# Patient Record
Sex: Male | Born: 1935 | Race: White | Hispanic: No | Smoking: Former smoker
Health system: Southern US, Community
[De-identification: ages and names within clinical notes are randomized; demographics above are authoritative.]

## PROBLEM LIST (undated history)

## (undated) DIAGNOSIS — J449 Chronic obstructive pulmonary disease, unspecified: Secondary | ICD-10-CM

## (undated) DIAGNOSIS — I2510 Atherosclerotic heart disease of native coronary artery without angina pectoris: Secondary | ICD-10-CM

## (undated) DIAGNOSIS — I639 Cerebral infarction, unspecified: Secondary | ICD-10-CM

## (undated) DIAGNOSIS — E662 Morbid (severe) obesity with alveolar hypoventilation: Secondary | ICD-10-CM

## (undated) HISTORY — DX: Chronic obstructive pulmonary disease, unspecified: J44.9

## (undated) HISTORY — PX: OTHER SURGICAL HISTORY: SHX169

## (undated) HISTORY — DX: Morbid (severe) obesity with alveolar hypoventilation: E66.2

## (undated) HISTORY — DX: Cerebral infarction, unspecified: I63.9

## (undated) HISTORY — DX: Atherosclerotic heart disease of native coronary artery without angina pectoris: I25.10

---

## 1988-12-28 HISTORY — PX: CORONARY ARTERY BYPASS GRAFT: SHX141

## 2002-03-16 ENCOUNTER — Ambulatory Visit (HOSPITAL_COMMUNITY): Admission: RE | Admit: 2002-03-16 | Discharge: 2002-03-16 | Payer: Self-pay | Admitting: Cardiology

## 2002-03-16 ENCOUNTER — Encounter: Payer: Self-pay | Admitting: Cardiology

## 2005-04-03 ENCOUNTER — Ambulatory Visit: Payer: Self-pay | Admitting: Internal Medicine

## 2005-10-20 ENCOUNTER — Ambulatory Visit: Payer: Self-pay | Admitting: Internal Medicine

## 2006-02-25 ENCOUNTER — Ambulatory Visit: Payer: Self-pay | Admitting: Internal Medicine

## 2006-03-02 ENCOUNTER — Ambulatory Visit: Payer: Self-pay | Admitting: Internal Medicine

## 2006-11-25 ENCOUNTER — Ambulatory Visit: Payer: Self-pay | Admitting: Internal Medicine

## 2007-11-17 ENCOUNTER — Telehealth (INDEPENDENT_AMBULATORY_CARE_PROVIDER_SITE_OTHER): Payer: Self-pay | Admitting: *Deleted

## 2007-11-18 ENCOUNTER — Telehealth (INDEPENDENT_AMBULATORY_CARE_PROVIDER_SITE_OTHER): Payer: Self-pay | Admitting: *Deleted

## 2007-12-02 ENCOUNTER — Ambulatory Visit: Payer: Self-pay | Admitting: Internal Medicine

## 2007-12-07 ENCOUNTER — Telehealth: Payer: Self-pay | Admitting: Internal Medicine

## 2008-05-31 DIAGNOSIS — E119 Type 2 diabetes mellitus without complications: Secondary | ICD-10-CM

## 2008-05-31 DIAGNOSIS — I509 Heart failure, unspecified: Secondary | ICD-10-CM

## 2008-05-31 DIAGNOSIS — I6350 Cerebral infarction due to unspecified occlusion or stenosis of unspecified cerebral artery: Secondary | ICD-10-CM | POA: Insufficient documentation

## 2008-05-31 DIAGNOSIS — E678 Other specified hyperalimentation: Secondary | ICD-10-CM

## 2008-05-31 DIAGNOSIS — M109 Gout, unspecified: Secondary | ICD-10-CM

## 2008-05-31 DIAGNOSIS — J439 Emphysema, unspecified: Secondary | ICD-10-CM | POA: Insufficient documentation

## 2008-05-31 DIAGNOSIS — I2510 Atherosclerotic heart disease of native coronary artery without angina pectoris: Secondary | ICD-10-CM | POA: Insufficient documentation

## 2008-06-01 ENCOUNTER — Encounter: Payer: Self-pay | Admitting: Internal Medicine

## 2008-07-17 ENCOUNTER — Encounter: Payer: Self-pay | Admitting: Internal Medicine

## 2008-10-09 ENCOUNTER — Telehealth (INDEPENDENT_AMBULATORY_CARE_PROVIDER_SITE_OTHER): Payer: Self-pay | Admitting: *Deleted

## 2008-12-31 ENCOUNTER — Encounter (INDEPENDENT_AMBULATORY_CARE_PROVIDER_SITE_OTHER): Payer: Self-pay | Admitting: *Deleted

## 2009-01-02 ENCOUNTER — Telehealth (INDEPENDENT_AMBULATORY_CARE_PROVIDER_SITE_OTHER): Payer: Self-pay | Admitting: *Deleted

## 2009-01-04 ENCOUNTER — Encounter (INDEPENDENT_AMBULATORY_CARE_PROVIDER_SITE_OTHER): Payer: Self-pay | Admitting: *Deleted

## 2009-02-18 ENCOUNTER — Encounter (INDEPENDENT_AMBULATORY_CARE_PROVIDER_SITE_OTHER): Payer: Self-pay | Admitting: *Deleted

## 2009-02-18 ENCOUNTER — Ambulatory Visit: Payer: Self-pay | Admitting: Internal Medicine

## 2009-02-25 ENCOUNTER — Telehealth (INDEPENDENT_AMBULATORY_CARE_PROVIDER_SITE_OTHER): Payer: Self-pay | Admitting: *Deleted

## 2009-02-27 ENCOUNTER — Ambulatory Visit: Payer: Self-pay | Admitting: Internal Medicine

## 2009-03-18 ENCOUNTER — Encounter: Payer: Self-pay | Admitting: Internal Medicine

## 2009-07-03 ENCOUNTER — Encounter: Payer: Self-pay | Admitting: Internal Medicine

## 2009-09-19 ENCOUNTER — Telehealth (INDEPENDENT_AMBULATORY_CARE_PROVIDER_SITE_OTHER): Payer: Self-pay | Admitting: *Deleted

## 2009-11-26 ENCOUNTER — Encounter: Payer: Self-pay | Admitting: Internal Medicine

## 2010-03-25 IMAGING — CR DG CHEST 2V
1 series · 1 of 1 positions shown · non-contrast
Comparison: 10/20/2005

CLINICAL DATA: COPD

CHEST - 2 VIEW

[view not recorded]
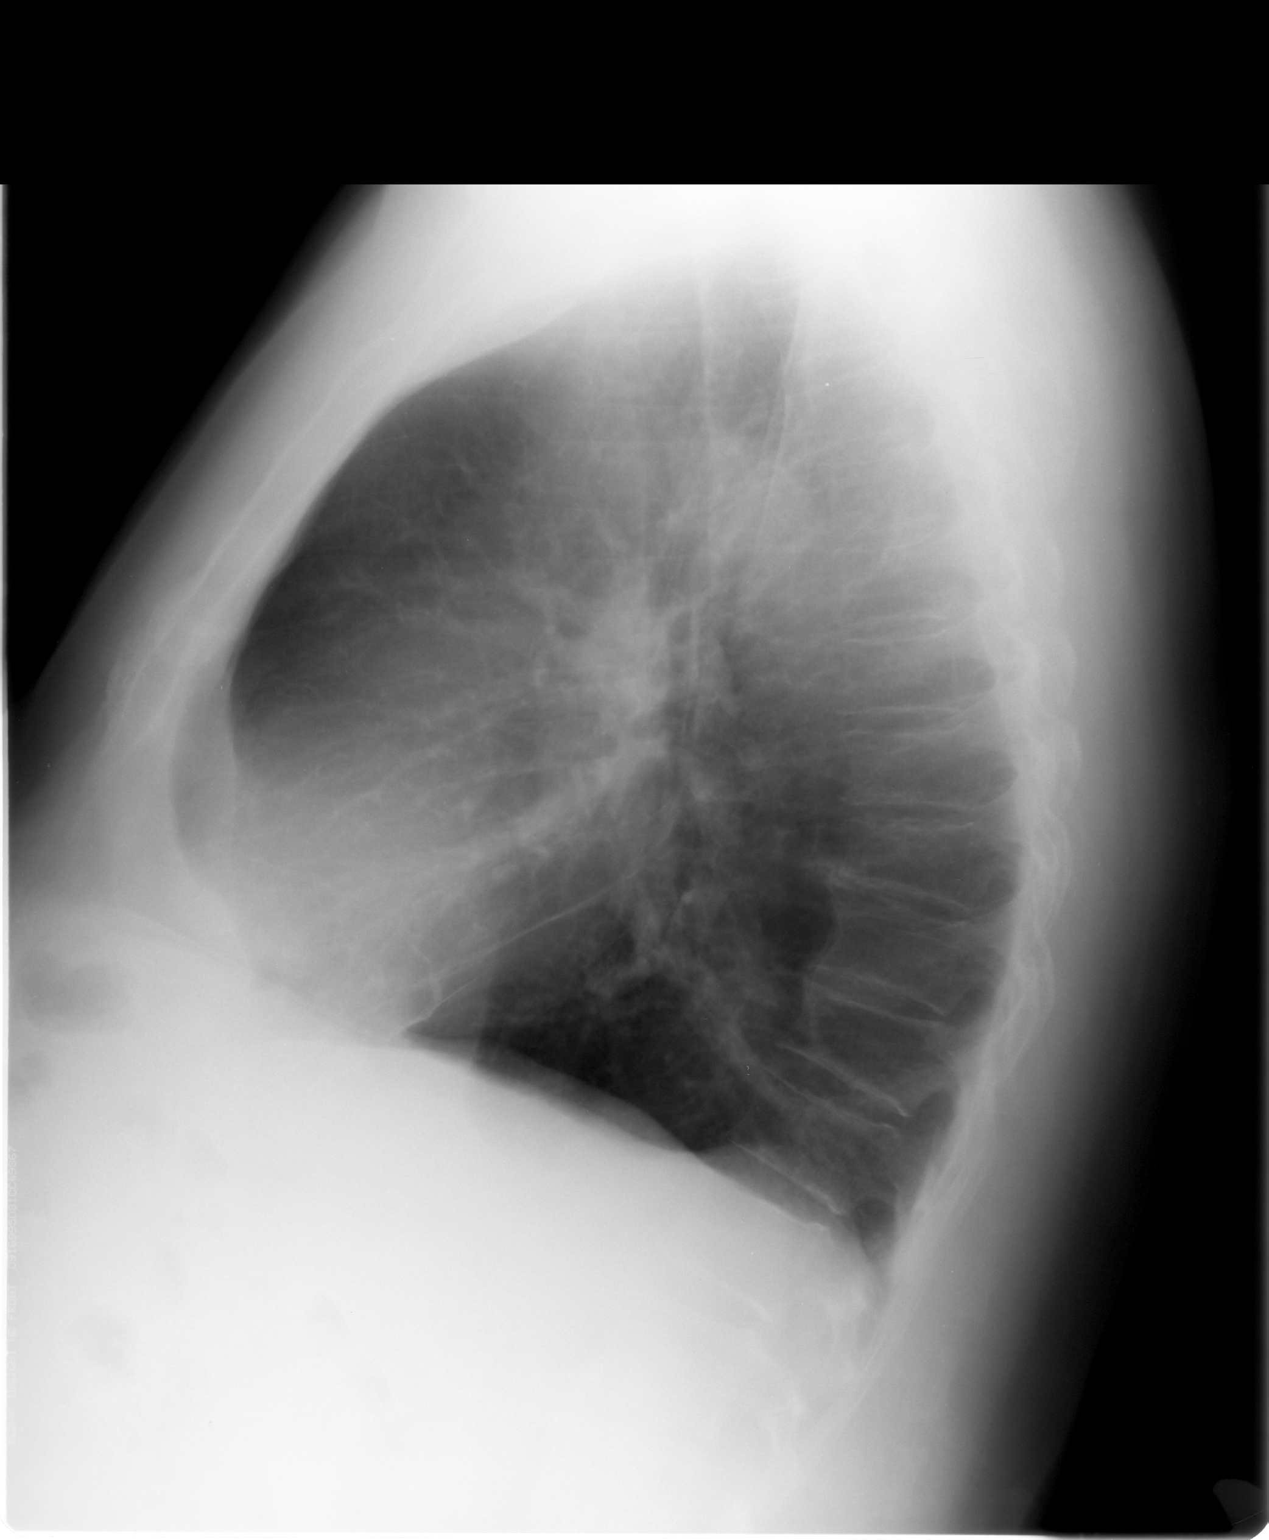

[1 of 1 positions shown; findings below may reference images not displayed]

FINDINGS: Heart size is normal.

No pleural effusions or pulmonary interstitial edema noted.

There is no airspace consolidation.

There is interstitial change consistent with COPD.
IMPRESSION: 1.  No acute findings.
2.  COPD.

## 2010-04-07 ENCOUNTER — Encounter: Payer: Self-pay | Admitting: Internal Medicine

## 2010-05-27 ENCOUNTER — Telehealth (INDEPENDENT_AMBULATORY_CARE_PROVIDER_SITE_OTHER): Payer: Self-pay | Admitting: *Deleted

## 2010-06-03 ENCOUNTER — Ambulatory Visit: Payer: Self-pay | Admitting: Internal Medicine

## 2010-07-22 ENCOUNTER — Telehealth (INDEPENDENT_AMBULATORY_CARE_PROVIDER_SITE_OTHER): Payer: Self-pay | Admitting: *Deleted
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
## 2011-01-27 NOTE — Letter (Signed)
Summary: [facility]  [facility]   Imported By: Lennie Odor 04/16/2010 10:44:34  _____________________________________________________________________  External Attachment:    Type:   Image     Comment:   External Document

## 2011-01-27 NOTE — Progress Notes (Signed)
Summary: prescription --LMTCB x 1  Phone Note Call from Patient Call back at [phone]   Caller: Daughter kari Call For: young Summary of Call: pt need albuterol inhaler [facility] at [phone].  Follow-up for Phone Call        Proair Rx sent to [facility].  LMOMTCB to inform daughter of this. Gweneth Dimitri RN  July 22, 2010 11:01 AM  [facility] informing pt's daughter rx sent to pharmacy.  Aundra Millet Reynolds LPN  July 22, 2010 2:44 PM      Prescriptions: PROAIR HFA 108 (90 BASE) MCG/ACT AERS (ALBUTEROL SULFATE) 2 sprays up to four times a day as needed  #3 x 3   Entered by:   Gweneth Dimitri RN   Authorized by:   Waymon Budge MD   Signed by:   Gweneth Dimitri RN on 07/22/2010   Method used:   Electronically to        [facility] (mail-order)       [address]       [place], [place]  [address]       Ph: [phone]       Fax: [phone]   RxID:   [id number]

## 2011-01-27 NOTE — Progress Notes (Signed)
Summary: refill on albuterol- pt needs ov  Phone Note Call from Patient Call back at [phone]   Caller: Daughter carrie Call For: young Summary of Call: calling for refill on albuterol neb .083% pres[facility] # [id number] pt almost out Initial call taken by: Rickard Patience,  May 27, 2010 4:41 PM  Follow-up for Phone Call        called and spoke with daughter, Lyla Son.  Informed Lyla Son pt is overdue for a f/u appt.  Pt was last seen Feb 2011.  Lyla Son did schedule pt for f/u appt 06-03-2010 at 10:15am.  Informed Lyla Son a 1 time refill will be sent to pharmacy of choice ([facility]) but pt must keep this appt for future refills.  carrie verbalized understanding. Marland KitchenArman Filter LPN  May 27, 2010 5:12 PM     Prescriptions: ALBUTEROL SULFATE (2.5 MG/3ML) 0.083%  NEBU (ALBUTEROL SULFATE) use four times a day  #120 x 0   Entered by:   Arman Filter LPN   Authorized by:   Waymon Budge MD   Signed by:   Arman Filter LPN on 91/47/8295   Method used:   Electronically to        [facility] (mail-order)       [address]       [place], [place]  [address]       Ph: [phone]       Fax: [phone]   RxID:   [id number]

## 2011-02-10 ENCOUNTER — Telehealth: Payer: Self-pay | Admitting: Internal Medicine

## 2011-02-18 NOTE — Progress Notes (Signed)
  Phone Note Refill Request Message from:  Fax from Pharmacy  Refills Requested: Medication #1:  ALBUTEROL SULFATE 4 MG  TABS Take 1 by mouth two times a day as needed   Supply Requested: 3 months Initial call taken by: Zackery Barefoot CMA,  February 10, 2011 10:16 AM    Prescriptions: ALBUTEROL SULFATE 4 MG  TABS (ALBUTEROL SULFATE) Take 1 by mouth two times a day as needed  #180 x 3   Entered by:   Zackery Barefoot CMA   Authorized by:   Waymon Budge MD   Signed by:   Zackery Barefoot CMA on 02/10/2011   Method used:   Faxed to ...       [facility] [address]* (retail)       [address]       [place], [place]  [address]       Ph: [phone] or [phone]       Fax: [phone]   RxID:   [id number]

## 2011-05-12 NOTE — Assessment & Plan Note (Signed)
 HEALTHCARE                             PULMONARY OFFICE NOTE   TIELER, COURNOYER                        MRN:          [id number]  DATE:12/02/2007                            DOB:          [birth year]    PROBLEMS:  1. Chronic obstructive pulmonary disease.  2. Obesity hypoventilation syndrome.  3. Coronary artery disease with history of heart failure.  4. Status post cerebral vascular accident.  5. Diabetes.  6. History of gout.   HISTORY:  First visit since March 2007. He had been hospitalized in [facility] a year ago for his respiratory problems. We had gotten a request  from [employer] to follow up on oxygen status with an overnight oximetry  on 11/14/2007 recording a nadir of 83% on room air and 14 minutes with  saturation less than 88% during sleep. He does not remember how he got  in touch with that company. Pulmonary function tests here on 03/02/2006  had shown severe chronic obstructive pulmonary disease with an FEV1 of  0.95 (41% of predicted) and an FEV1/FVC of 0.39. Diffusion was 53% of  predicted. Lung volumes were not valid.   MEDICATIONS:  1. Home nebulizer with albuterol q.i.d.  2. Albuterol/Vospire tablets 4 mg b.i.d. p.r.n.  3. Plavix 75 mg.  4. Lorazepam 1 mg b.i.d.  5. Enalapril 20 mg.  6. Avandaryl 4 mg/Glimepiride 2 mg.  7. Bumetanide 1 mg.  8. Ecotrin.  9. Albuterol inhaler p.r.n.   Drug intolerant CODEINE, MORPHINE, and TYLENOL.   OBJECTIVE:  Weight 184 pounds, blood pressure 120/74, pulse 74, room air  saturation at rest 96%. He seems alert and comfortable sitting quietly  on room air. Chest is quiet. Airflow is fair. There is no wheeze. He has  no neck vein distension, cyanosis, clubbing, or edema. Heart sounds are  regular without murmur.   IMPRESSION:  Severe chronic obstructive pulmonary disease with hypoxia  during sleep sufficient to justify oxygen at home at night during sleep.   PLAN:  1. Add home oxygen 2  liters per minute while sleeping with oxygen      discussion done.  2. Schedule return 6 months, earlier p.r.n.     Clinton D. Maple Hudson, MD, Tonny Bollman, FACP  Electronically Signed    [name]/MedQ  DD: 12/03/2007  DT: 12/04/2007  Job #: [id number]   cc:   Thereasa Solo. Little, M.D.  Jari Favre

## 2011-05-15 NOTE — Assessment & Plan Note (Signed)
[facility]                             PULMONARY OFFICE NOTE   VAISHNAV, DEMARTIN                        MRN:          [id number]  DATE:11/25/2006                            DOB:          Apr 23, 1930    PROBLEM:  1. Chronic obstructive pulmonary disease.  2. Diabetes.  3. Status post cerebrovascular accident.  4. Coronary disease/heart failure.  5. Obesity/hypoventilation.  6. Gout.   HISTORY:  Seen today without his full chart.  He did get flu vaccine.  There is remote history of pneumococcal vaccine he thinks about 10 years  ago.  He is not sure whether he got PFTs after his last visit here  (which I find interesting), but the technical records indicate that was  done and we will pull that report.  He denies any acute event or new  problems.   MEDICATIONS:  Unchanged from last visit.   ALLERGIES:  Drug intolerance to CODEINE, MORPHINE, TYLENOL.   OBJECTIVE:  Weight 180 pounds.  Blood pressure 110/60, pulse regular 68,  room air saturation 96%, quiet clear chest. Heart sounds are regular  without murmur or gallop.  There is no cyanosis or edema, no adenopathy.   IMPRESSION:  Clinically stable COPD.   PLAN:  1. We will seek the pulmonary function test report if done or      reschedule if necessary.  2. Pneumococcal vaccine booster.  3. Schedule return 6 months, earlier p.r.n.     Clinton D. Maple Hudson, MD, Tonny Bollman, FACP  Electronically Signed    [name]/MedQ  DD: 11/27/2006  DT: 11/28/2006  Job #: [id number]   cc:   Jari Favre

## 2011-05-21 ENCOUNTER — Encounter: Payer: Self-pay | Admitting: Internal Medicine

## 2011-06-03 ENCOUNTER — Ambulatory Visit: Payer: Self-pay | Admitting: Internal Medicine

## 2011-07-06 ENCOUNTER — Telehealth: Payer: Self-pay | Admitting: Internal Medicine

## 2011-07-06 MED ORDER — ALBUTEROL SULFATE (2.5 MG/3ML) 0.083% IN NEBU: 2.5000 mg | INHALATION_SOLUTION | RESPIRATORY_TRACT | Status: DC | PRN

## 2011-07-06 NOTE — Telephone Encounter (Signed)
Spoke with pt grand-daughter and she states the pt needs refill on neb medication. I advised pt last seen over 1 yr so he needs an appt. Pt set to see CY on 08-04-11. Refill sent to last till then. Carron Curie, CMA

## 2011-08-04 ENCOUNTER — Ambulatory Visit (INDEPENDENT_AMBULATORY_CARE_PROVIDER_SITE_OTHER): Payer: Medicare Other | Admitting: Internal Medicine

## 2011-08-04 ENCOUNTER — Encounter: Payer: Self-pay | Admitting: Internal Medicine

## 2011-08-04 VITALS — BP 138/62 | HR 69 | Wt 165.0 lb

## 2011-08-04 DIAGNOSIS — J449 Chronic obstructive pulmonary disease, unspecified: Secondary | ICD-10-CM

## 2011-08-04 MED ORDER — ALBUTEROL SULFATE (2.5 MG/3ML) 0.083% IN NEBU: 2.5000 mg | INHALATION_SOLUTION | RESPIRATORY_TRACT | Status: DC | PRN

## 2011-08-04 NOTE — Patient Instructions (Signed)
Continue present meds.  Ok to sent refill for neb solution to [facility]  3 month script

## 2011-08-04 NOTE — Progress Notes (Signed)
Subjective:    Patient ID: Brian Kirby, male    DOB: September 08, 1930, 77 y.o.   MRN: [id number]  HPI 08/04/11- 88 yoM former smoker followed for severe COPD/ emphysema Last here - June 03, 2010- PFT and CXR reviewed from that visit. Had CXR in [facility] in October.  He had fallen > replaced left hip. Had cancer resected right mandible. -  Breathing not much changed- ok at rest, little cough, no phlegm. Sleeps with O2 3l/m ? Company. Very easy DOE with any light walking- paces himself, but never uses O2 in daytime.  Uses nebulizer, sometimes his inhaler.   Review of Systems Constitutional:   No-   weight loss, night sweats, fevers, chills, fatigue, lassitude. HEENT:   No-   headaches, difficulty swallowing, tooth/dental problems, sore throat,                  No-   sneezing, itching, ear ache, nasal congestion, post nasal drip,  CV:  No-   chest pain, orthopnea, PND, swelling in lower extremities, anasarca, dizziness, palpitations GI:  No-   heartburn, indigestion, abdominal pain, nausea, vomiting, diarrhea,                 change in bowel habits, loss of appetite Resp: + shortness of breath with exertion or at rest.  No-  excess mucus,             No-   productive cough,  No non-productive cough,  No-  coughing up of blood.              No-   change in color of mucus.  No- wheezing.   Skin: No-   rash or lesions. GU: No-   dysuria, change in color of urine, no urgency or frequency.  No- flank pain. MS:  No- Acute  joint pain or swelling.  No- decreased range of motion.  No- back pain. Psych:  No- change in mood or affect. No depression or anxiety.  No memory loss.     Objective:   Physical Exam General- Alert, Oriented, Affect-appropriate, Distress- none acute Skin- rash-none, lesions- none, excoriation- none Lymphadenopathy- none Head- atraumatic            Eyes- Gross vision intact, PERRLA, conjunctivae clear secretions            Ears- Hearing, canals- hearing aid left, quite  hard of hearing                       Nose- Clear, no-Septal dev, mucus, polyps, erosion, perforation             Throat- Mallampati II , mucosa clear , drainage- none, tonsils- atrophic   dentures Neck- flexible , trachea midline, no stridor , thyroid nl, carotid no bruit Chest - symmetrical excursion , unlabored           Heart/CV- RRR distant , no murmur , no gallop  , no rub, nl s1 s2                           - JVD- none , edema- none, stasis changes- none, varices- none           Lung- clear to P&A, wheeze- none, cough- none , dullness-none, rub- none           Chest wall-  Abd- tender-no, distended-no, bowel sounds-present, HSM- no Br/ Gen/ Rectal- Not  done, not indicated Extrem- cyanosis- none, clubbing, none, atrophy- none, strength- nl Neuro- grossly intact to observation         Assessment & Plan:

## 2011-08-04 NOTE — Assessment & Plan Note (Addendum)
Severe COPD with multiple comorbidities and weakness. He seems well controlled now. We discussed meds and will refill neb solution. Continue oxygen for sleep.

## 2011-08-08 ENCOUNTER — Encounter: Payer: Self-pay | Admitting: Internal Medicine

## 2011-08-17 ENCOUNTER — Telehealth: Payer: Self-pay | Admitting: Internal Medicine

## 2011-08-17 NOTE — Telephone Encounter (Signed)
I called and spoke with company-aware we faxed back last week regarding this matter but gave verbal over phone for directions of 1 vial qid prn.

## 2011-11-23 ENCOUNTER — Telehealth: Payer: Self-pay | Admitting: Internal Medicine

## 2011-11-23 MED ORDER — ALBUTEROL SULFATE (2.5 MG/3ML) 0.083% IN NEBU: 2.5000 mg | INHALATION_SOLUTION | Freq: Four times a day (QID) | RESPIRATORY_TRACT | Status: DC | PRN

## 2011-11-23 NOTE — Telephone Encounter (Signed)
Pt's daughter says that the pt has new insurance and now needs to [facility] [address] [facility] [place]. Daughter aware this has been done and to call if there are any problems. Daughter aware this may need to be filed under MCR Part B.

## 2011-11-24 ENCOUNTER — Other Ambulatory Visit: Payer: Self-pay | Admitting: Internal Medicine

## 2011-11-24 ENCOUNTER — Telehealth: Payer: Self-pay | Admitting: Internal Medicine

## 2011-11-24 MED ORDER — ALBUTEROL SULFATE (2.5 MG/3ML) 0.083% IN NEBU: 2.5000 mg | INHALATION_SOLUTION | Freq: Four times a day (QID) | RESPIRATORY_TRACT | Status: DC | PRN

## 2011-11-24 NOTE — Telephone Encounter (Signed)
BETSY W/ [employer] CALLED BACK. SHE STATES THAT PART D COVERAGE HAS DENIED THE ALBUTEROL BUT PT IS COVERED UNDER MEDICARE PART B. ALSO ADVISES THAT "[facility]" WILL FILE AN ELECTRONIC CLAIM WITH BLUE MEDICARE IF PT WANTS TO GET THIS FROM [facility]. Hazel Sams

## 2011-11-24 NOTE — Telephone Encounter (Signed)
RX PRINTED AND DID NOT GO ELECTRONICALLY.

## 2011-11-24 NOTE — Telephone Encounter (Signed)
I spoke with Brian Kirby and she wants this sent to [facility] on [address]. She will take all of the pt's insurance information when she goes to pick this up. She is aware this will need to filed under MCR Part B. New rx sent to [facility].

## 2011-11-24 NOTE — Telephone Encounter (Signed)
Spoke with [place] at [facility] @ [phone]. Member # [id number]. Gave clinical information via phone call. Tamela Oddi was going to review this with her supervisor and see if this can be approved for the patient tonight. If approved, pt will possibly need to get this medication at [facility] or through the DME company because they will file this electronically with MCR Part B. Will await callback from [facility].

## 2011-11-27 ENCOUNTER — Telehealth: Payer: Self-pay | Admitting: Internal Medicine

## 2011-11-27 MED ORDER — ALBUTEROL SULFATE (2.5 MG/3ML) 0.083% IN NEBU: 2.5000 mg | INHALATION_SOLUTION | Freq: Four times a day (QID) | RESPIRATORY_TRACT | Status: DC | PRN

## 2011-11-27 NOTE — Telephone Encounter (Signed)
Spoke with pt's daughter and she states that the [facility] on [address] never received rx for albuterol. I looked back and looks like that is b/c it was sent to [facility] on [address] in [place], not [place]. I have resent this to the correct pharm and nothing further needed.

## 2012-01-04 ENCOUNTER — Telehealth: Payer: Self-pay | Admitting: Internal Medicine

## 2012-01-04 MED ORDER — ALBUTEROL SULFATE HFA 108 (90 BASE) MCG/ACT IN AERS: 2.0000 | INHALATION_SPRAY | Freq: Four times a day (QID) | RESPIRATORY_TRACT | Status: DC | PRN

## 2012-01-04 NOTE — Telephone Encounter (Signed)
Pt's grandaughter informed that [facility].

## 2012-01-25 ENCOUNTER — Other Ambulatory Visit: Payer: Self-pay | Admitting: Internal Medicine

## 2012-02-18 ENCOUNTER — Other Ambulatory Visit: Payer: Self-pay | Admitting: Internal Medicine

## 2012-06-06 ENCOUNTER — Telehealth: Payer: Self-pay | Admitting: Internal Medicine

## 2012-06-06 MED ORDER — ALBUTEROL SULFATE (2.5 MG/3ML) 0.083% IN NEBU: INHALATION_SOLUTION | RESPIRATORY_TRACT | Status: DC

## 2012-06-06 MED ORDER — ALBUTEROL SULFATE HFA 108 (90 BASE) MCG/ACT IN AERS: 2.0000 | INHALATION_SPRAY | Freq: Four times a day (QID) | RESPIRATORY_TRACT | Status: DC | PRN

## 2012-06-06 NOTE — Telephone Encounter (Signed)
RX's have been sent and nothing further was needed 

## 2012-06-17 ENCOUNTER — Other Ambulatory Visit: Payer: Self-pay | Admitting: Internal Medicine

## 2012-06-17 MED ORDER — ALBUTEROL SULFATE (2.5 MG/3ML) 0.083% IN NEBU: INHALATION_SOLUTION | RESPIRATORY_TRACT | Status: DC

## 2012-06-17 NOTE — Telephone Encounter (Signed)
Pharmacy needed dx code for albuterol neb

## 2012-08-04 ENCOUNTER — Ambulatory Visit: Payer: Medicare Other | Admitting: Internal Medicine

## 2012-08-11 ENCOUNTER — Other Ambulatory Visit: Payer: Self-pay | Admitting: Internal Medicine

## 2012-09-14 ENCOUNTER — Ambulatory Visit: Payer: Medicare Other | Admitting: Internal Medicine

## 2012-11-29 ENCOUNTER — Telehealth: Payer: Self-pay | Admitting: Internal Medicine

## 2012-11-29 MED ORDER — ALBUTEROL SULFATE (2.5 MG/3ML) 0.083% IN NEBU: INHALATION_SOLUTION | RESPIRATORY_TRACT | Status: DC

## 2012-11-29 NOTE — Telephone Encounter (Signed)
Rx was sent to [facility] and notify the pt, and NA, no option to leave a msg [facility]

## 2012-11-30 NOTE — Telephone Encounter (Signed)
Left message that Rx has been sent to pharmacy; if any questions or concerns to please call our office.

## 2013-02-02 ENCOUNTER — Other Ambulatory Visit: Payer: Self-pay | Admitting: Internal Medicine

## 2013-02-14 ENCOUNTER — Other Ambulatory Visit: Payer: Self-pay | Admitting: Internal Medicine

## 2013-02-14 NOTE — Telephone Encounter (Signed)
Last OV 08/04/11 with no pending appts.  Please advise on Ventolin HFA refill.

## 2013-02-15 NOTE — Telephone Encounter (Signed)
Ok to refill ventolin  

## 2013-02-17 ENCOUNTER — Telehealth: Payer: Self-pay | Admitting: *Deleted

## 2013-02-17 MED ORDER — ALBUTEROL SULFATE HFA 108 (90 BASE) MCG/ACT IN AERS: 2.0000 | INHALATION_SPRAY | Freq: Four times a day (QID) | RESPIRATORY_TRACT | Status: DC | PRN

## 2013-02-17 NOTE — Telephone Encounter (Signed)
Spoke with the pt and he is requesting a refill on proair. Refill sent. Pt is aware. Carron Curie, CMA

## 2013-06-06 ENCOUNTER — Other Ambulatory Visit: Payer: Self-pay | Admitting: Internal Medicine

## 2013-06-09 ENCOUNTER — Telehealth: Payer: Self-pay | Admitting: Internal Medicine

## 2013-06-09 MED ORDER — ALBUTEROL SULFATE 4 MG PO TABS: 4.0000 mg | ORAL_TABLET | Freq: Two times a day (BID) | ORAL | Status: DC

## 2013-06-09 NOTE — Telephone Encounter (Signed)
Spoke to pt's granddaughter. I have refilled Proventil 4mg  tablets. Pt has an upcoming appointment on 6/2/314. I advised her that they need to make sure they come to that appointment. She agreed.

## 2013-06-19 ENCOUNTER — Ambulatory Visit (INDEPENDENT_AMBULATORY_CARE_PROVIDER_SITE_OTHER): Payer: Medicare Other | Admitting: Internal Medicine

## 2013-06-19 ENCOUNTER — Encounter: Payer: Self-pay | Admitting: Internal Medicine

## 2013-06-19 VITALS — BP 118/64 | HR 83 | Ht 63.5 in | Wt 158.2 lb

## 2013-06-19 DIAGNOSIS — E678 Other specified hyperalimentation: Secondary | ICD-10-CM

## 2013-06-19 DIAGNOSIS — J439 Emphysema, unspecified: Secondary | ICD-10-CM

## 2013-06-19 DIAGNOSIS — J438 Other emphysema: Secondary | ICD-10-CM

## 2013-06-19 NOTE — Progress Notes (Signed)
Subjective:    Patient ID: Brian Kirby, male    DOB: 1930/11/12, 79 y.o.   MRN: [id number]  HPI 08/04/11- 48 yoM former smoker followed for severe COPD/ emphysema Last here - June 03, 2010- PFT and CXR reviewed from that visit. Had CXR in [facility] in October.  He had fallen > replaced left hip. Had cancer resected right mandible. -  Breathing not much changed- ok at rest, little cough, no phlegm. Sleeps with O2 3l/m ? Company. Very easy DOE with any light walking- paces himself, but never uses O2 in daytime.  Uses nebulizer, sometimes his inhaler.   06/19/13- 29 yoM former smoker followed for severe COPD/ emphysema FOLLOWS FOR: last seent 2012; continues to have SOB with activity; pt uses O2 at night-entered exam at 85%RA; 1 minute post was at 90%RA. Dropped off plavix due to epistaxis, w/ no cardiology f/u since Dr Clarene Duke retired. Using nebulizer 4 times daily and occasional rescue inhaler. Denies acute problem. He still can't name his DME company for oxygen. Portable tank is too heavy. Had desaturated on arrival on room air.  Review of Systems Constitutional:   No-   weight loss, night sweats, fevers, chills, fatigue, lassitude. HEENT:   No-   headaches, difficulty swallowing, tooth/dental problems, sore throat,                  No-   sneezing, itching, ear ache, nasal congestion, post nasal drip,  CV:  No-   chest pain, orthopnea, PND, swelling in lower extremities, anasarca, dizziness, palpitations GI:  No-   heartburn, indigestion, abdominal pain, nausea, vomiting,  Resp: + shortness of breath with exertion or at rest.  No-  excess mucus,             No-   productive cough,  No non-productive cough,  No-  coughing up of blood.              No-   change in color of mucus.  No- wheezing.   Skin: No-   rash or lesions. GU:  MS:  No- Acute  joint pain or swelling.  Marland Kitchen Psych:  No- change in mood or affect. No depression or anxiety.  No memory loss.  Objective:   Physical  Exam General- Alert, Oriented, Affect-appropriate, Distress- none acute Skin- rash-none, lesions- none, excoriation- none Lymphadenopathy- none Head- atraumatic            Eyes- Gross vision intact, PERRLA, conjunctivae clear secretions            Ears- Hearing, canals- hearing aid left, +quite hard of hearing                       Nose- Clear, no-Septal dev, mucus, polyps, erosion, perforation             Throat- Mallampati II , mucosa clear , drainage- none, tonsils- atrophic   dentures Neck- flexible , trachea midline, no stridor , thyroid nl, carotid no bruit Chest - symmetrical excursion , unlabored           Heart/CV- RRR distant , no murmur , no gallop  , no rub, nl s1 s2                           - JVD- none , edema- none, stasis changes- none, varices- none           Lung- +distant/clear to  P&A, wheeze- none, cough- none , dullness-none, rub- none           Chest wall-  Abd-  Br/ Gen/ Rectal- Not done, not indicated Extrem- cyanosis- none, clubbing, none, atrophy- none, strength- nl Neuro- grossly intact to observation    Assessment & Plan:

## 2013-06-19 NOTE — Patient Instructions (Addendum)
We can continue present meds. Try to skip the albuterol/ Proventil pills unless really needed  Please call as needed  Be sure to follow up with your primary physician at least once per year.

## 2013-07-02 ENCOUNTER — Encounter: Payer: Self-pay | Admitting: Internal Medicine

## 2013-07-02 NOTE — Assessment & Plan Note (Signed)
We need to get the name of his home care company for oxygen supplies and have them assess for light portable oxygen. Standard portable tank is too heavy for him. Plan-chest x-ray

## 2013-07-02 NOTE — Assessment & Plan Note (Signed)
He has not been able to achieve weight loss

## 2013-07-12 ENCOUNTER — Other Ambulatory Visit: Payer: Self-pay | Admitting: Internal Medicine

## 2013-08-29 ENCOUNTER — Other Ambulatory Visit: Payer: Self-pay | Admitting: Internal Medicine

## 2013-09-04 ENCOUNTER — Telehealth: Payer: Self-pay | Admitting: Internal Medicine

## 2013-09-04 NOTE — Telephone Encounter (Signed)
lmomtcb x1 for cari

## 2013-09-05 MED ORDER — ALBUTEROL SULFATE 4 MG PO TABS: ORAL_TABLET | ORAL | Status: DC

## 2013-09-05 NOTE — Telephone Encounter (Signed)
Pt's daughter, Corliss Blacker, returned triage's call.  Advised Rx for albuterol tab was sent to [facility].  Carri verbalized understanding & states nothing further needed at this time.  Antionette Fairy

## 2013-09-05 NOTE — Telephone Encounter (Signed)
Albuterol tab rx sent to [facility].   lmomtcb to inform carri

## 2013-09-19 ENCOUNTER — Telehealth: Payer: Self-pay | Admitting: Internal Medicine

## 2013-09-19 NOTE — Telephone Encounter (Signed)
lmomtcb for [employer]

## 2013-09-20 NOTE — Telephone Encounter (Signed)
LMTCB

## 2013-09-20 NOTE — Telephone Encounter (Signed)
Spoke with Brian Kirby She states pt's ins no longer covers albuterol tablets  Rx for 1 mo costs over 400$ Please advise on alternative thanks

## 2013-09-20 NOTE — Telephone Encounter (Signed)
Brian Kirby returning call can be reached at [phone].Brian Kirby

## 2013-09-20 NOTE — Telephone Encounter (Signed)
Ask patient to try without the tabs. He can use his nebulizer up to every 4 hours WHEN NEEDED

## 2013-09-20 NOTE — Telephone Encounter (Signed)
LMTCB for Brian Kirby

## 2013-09-21 NOTE — Telephone Encounter (Signed)
Pt granddaughter is aware. Carron Curie, CMA

## 2013-09-21 NOTE — Telephone Encounter (Signed)
LMTCB

## 2013-10-03 ENCOUNTER — Telehealth: Payer: Self-pay | Admitting: Internal Medicine

## 2013-10-03 NOTE — Telephone Encounter (Signed)
Called, spoke with [name].  Albuterol tabs were going to be over $400 so the following recs were given:  Brian Budge, MD at 09/20/2013 8:18 PM   Status: Signed            Ask patient to try without the tabs. He can use his nebulizer up to every 4 hours WHEN NEEDED   ------  Brian Kirby reports pt is using the albuterol neb 4-5 times daily with no help.  States he is having a hard time breathing at rest and with exertion x 2 wks and some wheezing.  No c/o chest tightness.  [name] unsure of any cough.  She is requesting [name]'s further thoughts.  Dr. Maple Hudson, pls advise.  Thank you.  Allergies verified with [name]: Allergies  Allergen Reactions  . Acetaminophen   . Codeine   . Morphine     [facility]  Last OV with [name]: 06/19/13; asked to f/u in 1 yr

## 2013-10-03 NOTE — Telephone Encounter (Signed)
I spoke with Brian Kirby. I scheduled pt an appt per her schedule to see TP on Monday. Nothing further needed

## 2013-10-03 NOTE — Telephone Encounter (Signed)
Per CY-see for OV with TP.

## 2013-10-09 ENCOUNTER — Encounter: Payer: Self-pay | Admitting: Adult Health

## 2013-10-09 ENCOUNTER — Ambulatory Visit (INDEPENDENT_AMBULATORY_CARE_PROVIDER_SITE_OTHER)
Admission: RE | Admit: 2013-10-09 | Discharge: 2013-10-09 | Disposition: A | Discharge status: Home / Self Care | Payer: Medicare Other | Source: Ambulatory Visit | Attending: Adult Health | Admitting: Adult Health

## 2013-10-09 ENCOUNTER — Ambulatory Visit (INDEPENDENT_AMBULATORY_CARE_PROVIDER_SITE_OTHER): Payer: Medicare Other | Admitting: Adult Health

## 2013-10-09 VITALS — BP 116/62 | HR 83 | Temp 97.3°F | Ht 63.5 in | Wt 156.4 lb

## 2013-10-09 DIAGNOSIS — J438 Other emphysema: Secondary | ICD-10-CM

## 2013-10-09 DIAGNOSIS — Z23 Encounter for immunization: Secondary | ICD-10-CM

## 2013-10-09 DIAGNOSIS — J439 Emphysema, unspecified: Secondary | ICD-10-CM

## 2013-10-09 MED ORDER — IPRATROPIUM BROMIDE 0.02 % IN SOLN: 500.0000 ug | Freq: Four times a day (QID) | RESPIRATORY_TRACT | Status: DC

## 2013-10-09 NOTE — Patient Instructions (Signed)
Add Ipratropium Neb Four times a day   Continue on Albuterol Neb Four times a day   I will call with chest xray results.  follow up Dr. Maple Hudson  In 3 months and As needed

## 2013-10-09 NOTE — Assessment & Plan Note (Addendum)
Steroid Dependent severe COPD  W/ nocturnal O2 Dependence  More symptomatic off albuterol tabs . Unable to afford d/t insurance coverage.  Will add Atrovent neb Four times a day  W/ albuterol nebs for improved control  Check cxr today.  Flu shot utd  No change in steroid dose as no active bronchospasm currently .   Plan  Add Ipratropium Neb Four times a day   Continue on Albuterol Neb Four times a day   I will call with chest xray results.  follow up Dr. Maple Hudson  In 3 months and As needed

## 2013-10-09 NOTE — Progress Notes (Signed)
Subjective:    Patient ID: Brian Kirby, male    DOB: July 16, 1930, 79 y.o.   MRN: [id number]  HPI 08/04/11- 65 yoM former smoker followed for severe COPD/ emphysema Last here - June 03, 2010- PFT and CXR reviewed from that visit. Had CXR in [facility] in October.  He had fallen > replaced left hip. Had cancer resected right mandible. -  Breathing not much changed- ok at rest, little cough, no phlegm. Sleeps with O2 3l/m ? Company. Very easy DOE with any light walking- paces himself, but never uses O2 in daytime.  Uses nebulizer, sometimes his inhaler.   06/19/13- 87 yoM former smoker followed for severe COPD/ emphysema FOLLOWS FOR: last seent 2012; continues to have SOB with activity; pt uses O2 at night-entered exam at 85%RA; 1 minute post was at 90%RA. Dropped off plavix due to epistaxis, w/ no cardiology f/u since Dr Clarene Duke retired. Using nebulizer 4 times daily and occasional rescue inhaler. Denies acute problem. He still can't name his DME company for oxygen. Portable tank is too heavy. Had desaturated on arrival on room air.  10/09/2013 Acute OV -Young Pt with severe steroid/nocturnal O2 COPD pt.  Complains of increased SOB x2 weeks since running out of albuterol tabs (too expensive at >$400/mo).  denies any wheezing, chest congestion, tightness, cough.  increased albuterol neb with no relief. Takes albuterol Four times a day   On prednisone 5mg  daily . N Mild dry cough, no orthopnea, leg swelling, chest pain, edema.  Not much wheezing.  Insurance will no longer pay for albuterol tab-too costly for pt.  Of note was recommended to stop albuterol tabs last ov. With Dr. Maple Hudson   Last cxr with chronic changes 2012 .   Review of Systems Constitutional:   No-   weight loss, night sweats, fevers, chills, fatigue, lassitude. HEENT:   No-   headaches, difficulty swallowing, tooth/dental problems, sore throat,                  No-   sneezing, itching, ear ache, nasal congestion, post nasal  drip,  CV:  No-   chest pain, orthopnea, PND, swelling in lower extremities, anasarca, dizziness, palpitations GI:  No-   heartburn, indigestion, abdominal pain, nausea, vomiting,  Resp: + shortness of breath with exertion or at rest.  No-  excess mucus,             No-   productive cough,  No non-productive cough,  No-  coughing up of blood.              No-   change in color of mucus.  No- wheezing.   Skin: No-   rash or lesions. GU:  MS:  No- Acute  joint pain or swelling.  Marland Kitchen Psych:  No- change in mood or affect. No depression or anxiety.  No memory loss.  Objective:   Physical Exam General- Alert, Oriented, Affect-appropriate, Distress- none acute Skin- rash-none, lesions- none, excoriation- none Lymphadenopathy- none Head- atraumatic            Eyes- Gross vision intact, PERRLA, conjunctivae clear secretions            Ears- Hearing, canals- hearing aid left, +quite hard of hearing                       Nose- Clear, no-Septal dev, mucus, polyps, erosion, perforation             Throat-  Mallampati II , mucosa clear , drainage- none, tonsils- atrophic   dentures Neck- flexible , trachea midline, no stridor , thyroid nl, carotid no bruit Chest - symmetrical excursion , unlabored           Heart/CV- RRR distant , no murmur , no gallop  , no rub, nl s1 s2                           - JVD- none , edema- none, stasis changes- none, varices- none           Lung- +distant/clear to P&A, wheeze- none, cough- none , dullness-none, rub- none           Chest wall-  Abd-  Br/ Gen/ Rectal- Not done, not indicated Extrem- cyanosis- none, clubbing, none, atrophy- none, strength- nl Neuro- grossly intact to observation    Assessment & Plan:

## 2013-10-11 NOTE — Progress Notes (Signed)
Quick Note:  Called spoke with patient, advised of cxr results / recs as stated by TP. Pt verbalized his understanding and denied any questions. ______ 

## 2014-01-08 ENCOUNTER — Telehealth: Payer: Self-pay | Admitting: Internal Medicine

## 2014-01-08 DIAGNOSIS — J439 Emphysema, unspecified: Secondary | ICD-10-CM

## 2014-01-08 NOTE — Telephone Encounter (Signed)
LMTCB

## 2014-01-09 ENCOUNTER — Ambulatory Visit: Payer: Medicare Other | Admitting: Internal Medicine

## 2014-01-09 NOTE — Telephone Encounter (Signed)
lmomtcb x 2   For cari mann

## 2014-01-10 MED ORDER — ALBUTEROL SULFATE HFA 108 (90 BASE) MCG/ACT IN AERS: 2.0000 | INHALATION_SPRAY | Freq: Four times a day (QID) | RESPIRATORY_TRACT | Status: AC | PRN

## 2014-01-10 MED ORDER — IPRATROPIUM BROMIDE 0.02 % IN SOLN: 500.0000 ug | Freq: Four times a day (QID) | RESPIRATORY_TRACT | Status: AC

## 2014-01-10 MED ORDER — ALBUTEROL SULFATE (2.5 MG/3ML) 0.083% IN NEBU: INHALATION_SOLUTION | RESPIRATORY_TRACT | Status: AC

## 2014-01-10 NOTE — Telephone Encounter (Signed)
Spoke with the pt daughter and she states the pt pharmacy has changed to [facility] and they need rx for albuterol hfa, albuterol neb,. And atrovent neb sent. The want to continue to get through mail order. Advised to let us know if any issues and there are other options. Rx sent. Carron CurieJennifer Sheridan Gettel, CMA

## 2014-01-30 ENCOUNTER — Other Ambulatory Visit: Payer: Self-pay | Admitting: *Deleted

## 2014-01-30 MED ORDER — METAPROTERENOL SULFATE 10 MG PO TABS: 10.0000 mg | ORAL_TABLET | Freq: Two times a day (BID) | ORAL | Status: AC | PRN

## 2014-02-02 ENCOUNTER — Ambulatory Visit: Payer: Medicare Other | Admitting: Internal Medicine

## 2014-02-06 ENCOUNTER — Ambulatory Visit: Payer: Medicare Other | Admitting: Internal Medicine

## 2014-02-09 ENCOUNTER — Telehealth: Payer: Self-pay | Admitting: Internal Medicine

## 2014-02-09 ENCOUNTER — Telehealth: Payer: Self-pay

## 2014-02-09 NOTE — Telephone Encounter (Signed)
Patient states that man brought O2 tank out to his home and it is broken Pt states that he turned tank on last night and is ran okay for 45-6950mins then started squealing very loud and scared the patient. Pt is scared to turn tank back on, states it hurts his ears. [facility] : DME  ----- Contacted [facility], they are going to take a new tank to the patient. Will contact our office to let us know that the patient is set up.  Will forward to Katie to follow up on.

## 2014-02-09 NOTE — Telephone Encounter (Signed)
Spoke to [facility], states that they are picking up their 02 tanks and that [facility] is dropping new tanks off.    [facility] this to [facility] [place] as an [place].

## 2014-02-12 ENCOUNTER — Telehealth: Payer: Self-pay | Admitting: Internal Medicine

## 2014-02-12 NOTE — Telephone Encounter (Signed)
Spoke with the pt  He states still [place] not have [facility]2 [facility] yet, and has not heard from anyone  Spoke with [facility] and was advised that o2 has already been delivered to the pt  I called the pt back and the line was busy, [facility]

## 2014-02-14 NOTE — Telephone Encounter (Signed)
Spoke with the pt  He has been in contact with [facility]  Nothing further needed

## 2014-02-16 ENCOUNTER — Ambulatory Visit: Payer: Medicare Other | Admitting: Internal Medicine

## 2014-02-23 ENCOUNTER — Telehealth: Payer: Self-pay | Admitting: Internal Medicine

## 2014-02-23 ENCOUNTER — Ambulatory Visit: Payer: Medicare Other | Admitting: Internal Medicine

## 2014-02-23 NOTE — Telephone Encounter (Signed)
Spoke with the pt's  Rescheduled rov with [name] for 03/27/14  Nothing further needed

## 2014-03-27 ENCOUNTER — Ambulatory Visit: Payer: Medicare Other | Admitting: Internal Medicine

## 2014-06-19 ENCOUNTER — Ambulatory Visit: Payer: Medicare Other | Admitting: Internal Medicine

## 2014-09-27 DEATH — deceased

## 2015-09-25 ENCOUNTER — Encounter: Payer: Self-pay | Admitting: Cardiology
